# Patient Record
Sex: Male | Born: 1955 | Race: Black or African American | Hispanic: No | Marital: Single
Health system: Western US, Academic
[De-identification: ages and names within clinical notes are randomized; demographics above are authoritative.]

---

## 2019-09-30 IMAGING — MR MRI LUMBAR SPINE WITHOUT CONTRAST
6 series · 48 of 48 positions shown · non-contrast
Comparison: none

MAGNETIC RESONANCE IMAGING LUMBAR SPINE WITHOUT CONTRAST ADMINISTRATION
HISTORY: Lumbago.  Low back pain and left lumbar radiculopathy.  Claustrophobia.
TECHNIQUE: Multiplanar magnetic resonance imaging of the lumbar spine was accomplished employing a surface coil and an open 0.3[employer] [place] scanner without intravenous contrast administration.  T1 and T2-weighted thin slice sections were obtained.

[Series 4: scano sag-cor · coronal · 7.0mm · 1.48mm/px · 7 of 12 slices shown]
[im 1/12]
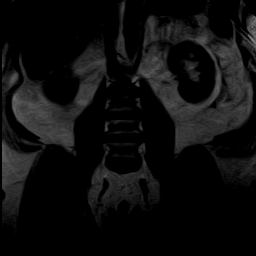
[im 2/12]
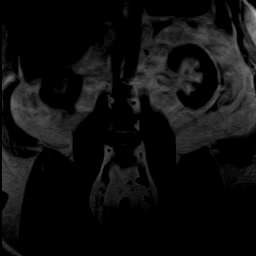
[im 4/12]
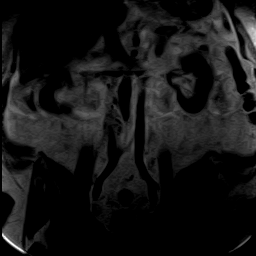
[im 6/12]
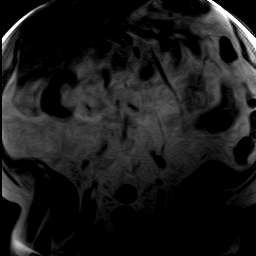
[im 8/12]
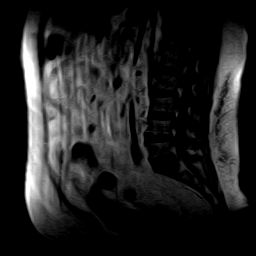
[im 10/12]
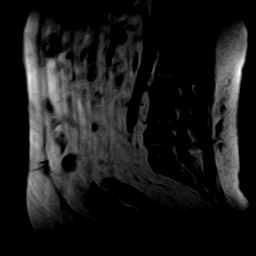
[im 12/12]
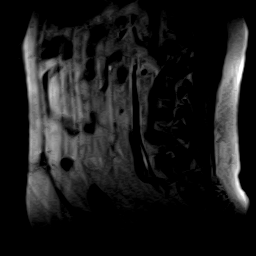

[Series 5: T2 · sagittal · 5.0mm · 1.21mm/px · 5 of 11 slices shown (1 of 2)]
[im 1/11]
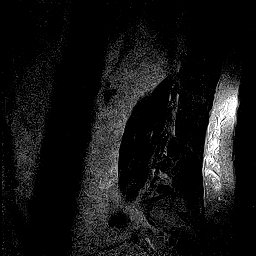
[im 3/11]
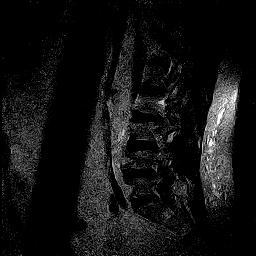
[im 6/11]
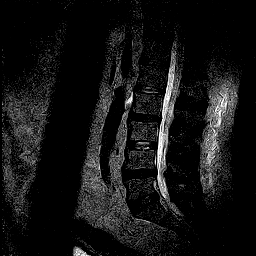
[im 8/11]
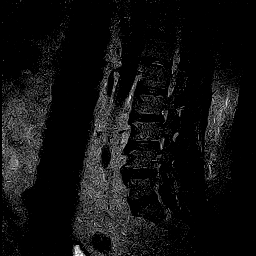
[im 11/11]
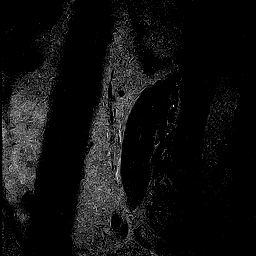

[Series 6: T1 · sagittal · 5.0mm · 1.21mm/px · 5 of 11 slices shown (1 of 2)]
[im 1/11]
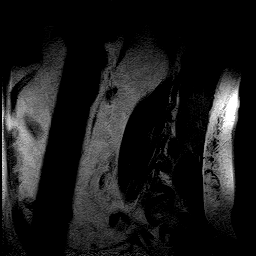
[im 3/11]
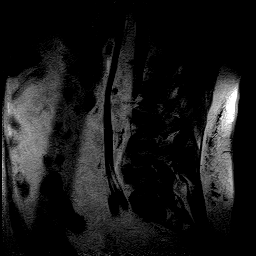
[im 6/11]
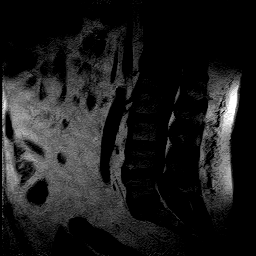
[im 8/11]
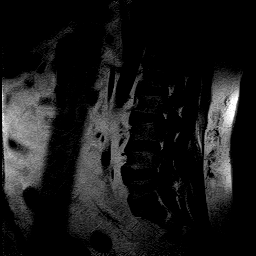
[im 11/11]
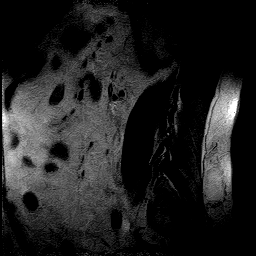

[Series 7: T1 · axial · 4.5mm · 1.05mm/px · z∈[-73,+109]mm · 13 of 28 slices shown (2 of 2)]
[im 1/28]
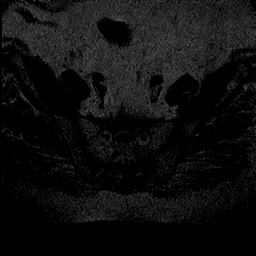
[im 3/28]
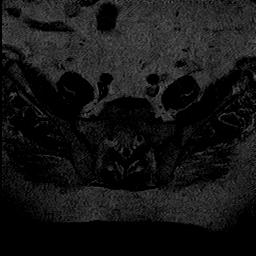
[im 5/28]
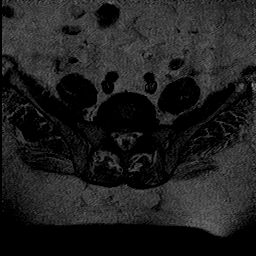
[im 7/28]
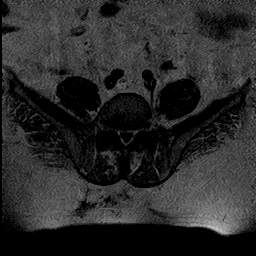
[im 10/28]
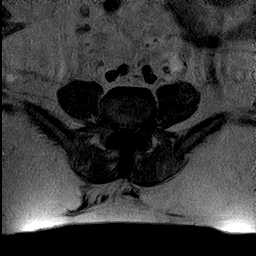
[im 12/28]
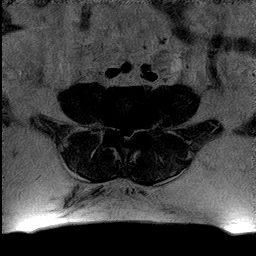
[im 14/28]
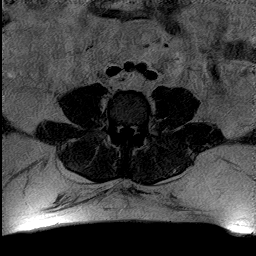
[im 16/28]
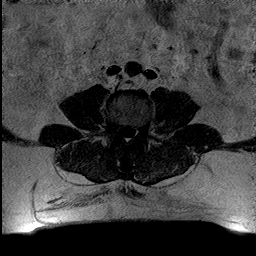
[im 19/28]
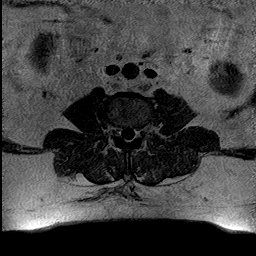
[im 21/28]
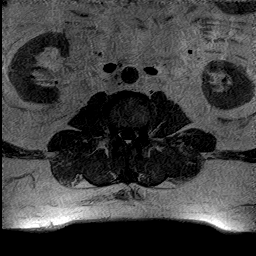
[im 23/28]
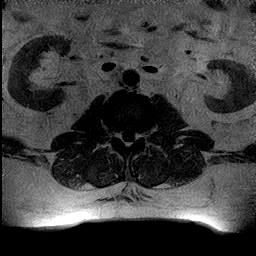
[im 25/28]
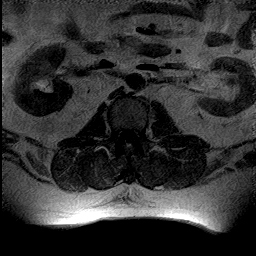
[im 28/28]
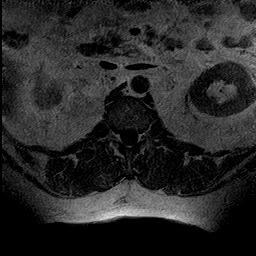

[Series 8: T2 · axial · 4.5mm · 1.05mm/px · z∈[-73,+109]mm · 13 of 28 slices shown (2 of 2)]
[im 1/28]
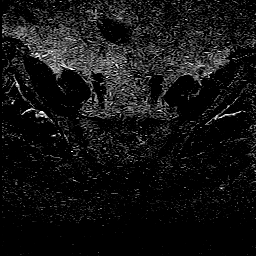
[im 3/28]
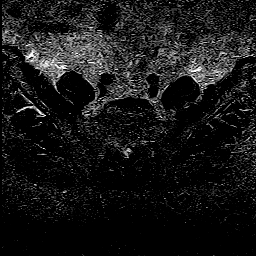
[im 5/28]
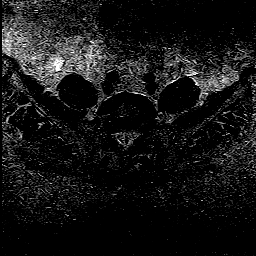
[im 7/28]
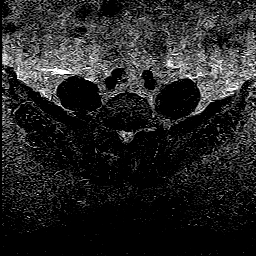
[im 10/28]
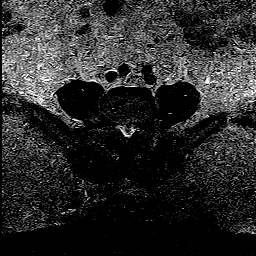
[im 12/28]
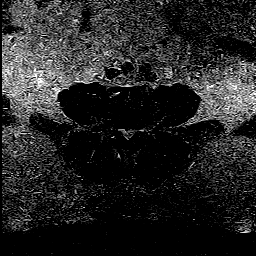
[im 14/28]
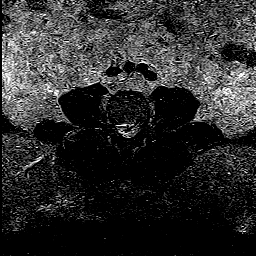
[im 16/28]
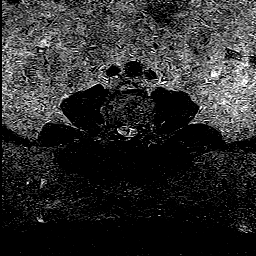
[im 19/28]
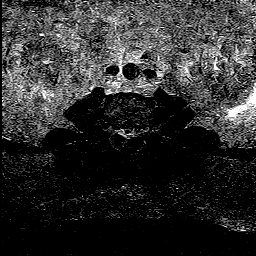
[im 21/28]
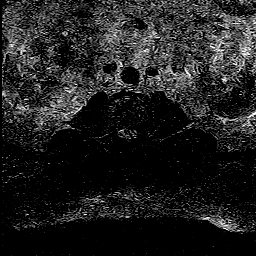
[im 23/28]
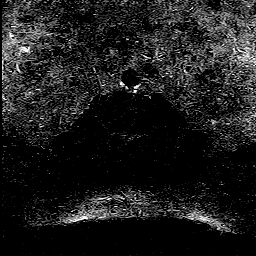
[im 25/28]
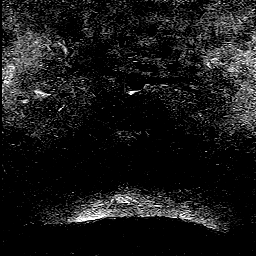
[im 28/28]
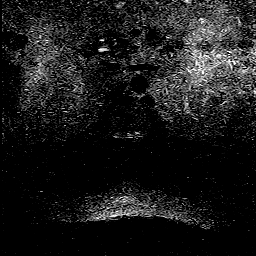

[Series 9: sag fir · sagittal · 5.0mm · 1.21mm/px · 5 of 11 slices shown]
[im 1/11]
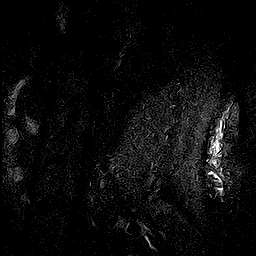
[im 3/11]
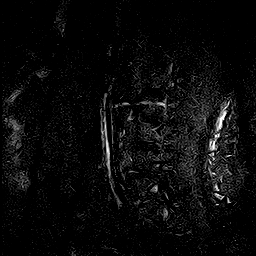
[im 6/11]
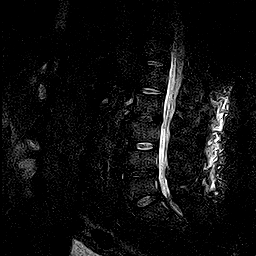
[im 8/11]
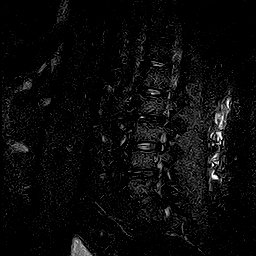
[im 11/11]
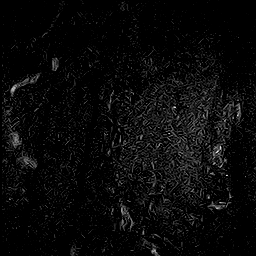

[48 of 48 positions shown; findings below may reference images not displayed]

FINDINGS: No prior studies are available for comparison purposes.

There is mild patient motion and spacial mismapping.  Patient claustrophobic. 

A minor lumbar levoscoliosis is demonstrated.  Anterior to posterior vertebral alignment maintained.  The lumbar vertebrae themselves appear normal in height.  There are no significant abnormalities of regional bone marrow signal.  

Visualized conus unremarkable.  Small (12 mm) left renal upper pole nodule suspected on coronal scout imaging alone.  Tiny bilateral renal cysts included on axial images.  Persistent left-sided inferior vena cava.  No additional focal abnormalities of visualized paraspinal soft tissues apparent. 

An 8 mm nodular focus of diminished T1 signal intensity left medial iliac bone, opposing sacroiliac joint, possibly degenerative sclerosis alone. 

Moderate lumbar degenerative disc disease present diffusely with disc desiccation and disc space narrowing.  Spondylotic change present as well with facet joint and ligamentous overgrowth and extradural spurring. 

Minor inferior epidural lipomatosis. 

There is mild inferior lumbar congenital shortness of vertebral pedicles.  

Examination of the L4-L5 interspace demonstrates spondylosis with moderate broad-based degenerative disc and annular bulging ventrally though significant facet joint and ligamentous overgrowth dorsally.  Moderate bilateral lateral recess stenoses are present.  Moderate congenital and acquired central spinal canal stenosis with the AP diameter of the thecal sac estimated at approximately 7 mm.  Minor foraminal narrowing without compromise. 

Examination of the L5-S1 interspace demonstrates early spondylosis with degenerative disc and annular bulging, capped by exuberant epidural fat.  There is no significant accentuation of congenital spinal canal narrowing.  Minor right greater than left L5 neural foraminal stenoses.  

Early spondylosis L2-L3 and L3-L4 interspaces without canal or foraminal compromise.  There are no significant extradural abnormalities at L1-L2.
IMPRESSION: Moderate lumbar spondylosis and degenerative disc disease in patient with epidural lipomatosis and mild congenital shortness of vertebral pedicles.

Congenital and acquired moderate bilateral lateral recess and central spinal canal stenoses at the L4-L5 interspace as discussed.  

Tiny focus of nonspecific bone marrow signal abnormality medial left iliac bone, possibly degenerative sclerosis alone.  

Suspected tiny renal cyst.  Small partially visualized left renal upper pole cortical nodule which could represent a cyst alone though is indeterminate as this is seen on only localization evaluation of spine.

Please see in-depth description by level in body of report.

## 2019-10-01 ENCOUNTER — Ambulatory Visit: Admit: 2019-10-01 | Discharge: 2019-10-15 | Payer: PRIVATE HEALTH INSURANCE | Primary: Physician

## 2019-10-01 DIAGNOSIS — Z794 Long term (current) use of insulin: Secondary | ICD-10-CM

## 2019-10-01 DIAGNOSIS — E119 Type 2 diabetes mellitus without complications: Secondary | ICD-10-CM

## 2019-10-01 DIAGNOSIS — E785 Hyperlipidemia, unspecified: Secondary | ICD-10-CM

## 2019-10-01 DIAGNOSIS — I10 Essential (primary) hypertension: Secondary | ICD-10-CM

## 2019-10-01 MED ORDER — VICTOZA 2-PAK 0.6 MG/0.1 ML (18 MG/3 ML) SUBCUTANEOUS PEN INJECTOR
0.60.1183 mg/0.1 mL (18 mg/3 mL) | Freq: Every day | SUBCUTANEOUS | 11 refills | Status: AC
Start: 2019-10-01 — End: 2022-10-01

## 2019-10-01 MED ORDER — ONETOUCH VERIO TEST STRIPS
ORAL_STRIP | 11 refills | Status: DC
Start: 2019-10-01 — End: 2019-11-21

## 2019-10-01 NOTE — Patient Instructions (Addendum)
Assess 1   Type 2 diabetes doing well with occasional hypoglycemia.  He must cut back on food and exercise more if possible and once he starts this new lifestyle, he should decrease his insulin by 50% for starters and then readjust as needed afterward. He sees an Administrator, arts in [place], [place]. (Dr. Jacelyn Grip)  2  Hypertension  3  Hyperlipidemia  4  Chronic back Pain  Plan   Labs in three months  Hemoglobin A1C,   24 hour urine microalbumin (needed annually for diabetes)  Chemistry Panel  CBC,  Lipids  UA  C-Reactive Protein   Use Dexcom G6 CGM for two weeks every three months.  See the eye doctor annually and least once year and he has an eye doctor visit scheduled in August, 2021.  See me in three months

## 2019-10-01 NOTE — Progress Notes (Addendum)
I performed this evaluation using real-time telehealth tools, including a live video Zoom connection between my location and the patient's location. Prior to initiating, I obtained informed verbal consent to perform this evaluation using telehealth tools and answered all the questions about the telehealth interaction.     CC  Type 2 diabetes x 31 years  HPI-   He also has hypertension. Home BP readings are usually around being in the 120s over the 70s.  No known diabetes end organ complications. No history of MI or CVA.    Current Meds  1) Insulin: Lantus 50 -100 Units twice daily  2) Humalog 35-45 Units 3-4 x per day  3) Victoza 1.8 mg daily each AM (control improved greatly after he started this medication and he had fewer hypoglycemic episodes after he started this medication).  4) Crestor 20 mg/day  5) Carvedilol   6) Allopurinol  7) HCTZ 50 mg/day  8) Furosemide  9) Spironolactone  10) Gabapentin  11) Benzonate (Tessalon)  12) Tamsulosin  13) Topiramate (for pain)  14) Amlodipine  15) Clotrimazole Cream  16) Cyproheptadine (Peri-Actin)    He had bilateral cataract surgery in December 2020 January 2021.    He has used an [employer] and found it helpful  preventing.  His insurance will not cover [facility] but they will cover the  Dexcom G6.  He thinks he has better coverage for the Dexcom rather than the [employer]).  He prefers as long as possible.    Severe hypoglycemia has occurred with him about twice yearly down to 35 mg/dl.  He has never required assistance but he gets confused,  when he has symptoms of hypoglycemia.  This might occur if he takes his insulin but ends up not eating because he is distracted for some reason.    He was not issued BG test strips in the past four months because of no coverage.  He tests 6-7 times per day usually and BGs woud run 85-160 with .  The 160 reading occur at bedtime.  He adjusts his insulin dose based on the BG levels.  For example, if premeal  BG is high then he might increase the Humalog from 35 to 45 Units at a meal.  He does not adjust the Lantus dose.  He occasionally awakens with hypoglycemic symptoms.  He has taken four classes about diabetes.  No recent A1C.  In [place] in December   It was under 7.0 (around 6.7%)  He complains of hand tingling.  Diet  one meal per day. It can be as little as a bowl of oat cereal and milk, plus a meat sandwich on pumpernickel bread.  He has lost 30 pounds in the past year.  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  He will be on disability soon because of back and left lower extremity and neck injuries.  PE  height 5 9.5  weight 248 pounds  Assess 1   Type 2 diabetes doing well with occasional hypoglycemia.  He must cut back on food and exercise more if possible and once he starts this new lifestyle, he should decrease his insulin by 50% for starters and then readjust as needed afterward. He sees an Administrator, arts in [place], [place]. (Dr. Jacelyn Grip)  2  Hypertension  3  Hyperlipidemia  4  Chronic back Pain  Plan   Labs in three months  Hemoglobin A1C,   24 hour urine microalbumin (  needed annually for diabetes)  Chemistry Panel  CBC,  Lipids  UA  C-Reactive Protein  Use Dexcom G6 CGM for two weeks every three months.  See the eye doctor annually and least once year and he has an eye doctor visit scheduled in August, 2021.  See me in three months    ADDENDUM -   The patient checks his blood glucose levels 6-7 times per day.  The patient's insulin treatment regimen requires frequent adjustments due to self-testing results.  The patient has had a doctor's visit with me as of October 01, 2019, which is within six months of this addendum.  Elisha Headland, MD  December 17, 2019

## 2019-10-23 IMAGING — MR MRI CERVICAL SPINE WITHOUT CONTRAST
8 of 9 series · 34 of 48 positions shown · non-contrast
Comparison: none

MAGNETIC RESONANCE IMAGING CERVICAL SPINE WITHOUT CONTRAST ADMINISTRATION
HISTORY: Cervicalgia.  Claustrophobia.
TECHNIQUE: Multiplanar magnetic resonance imaging of the cervical spine was accomplished utilizing a surface coil and an open 0.3[employer] scanner without intravenous contrast administration.  T1 and T2-weighted thin slice sections were obtained.

[Series 3: sag scano · sagittal · 4.0mm · 1.09mm/px · 2 of 7 slices shown]
[im 1/7]
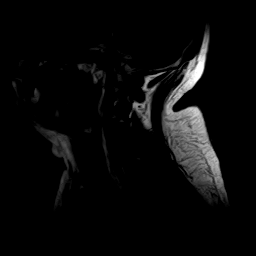
[im 7/7]
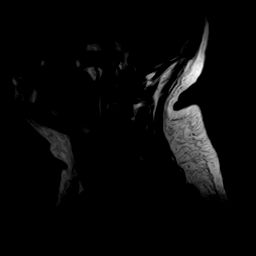

[Series 4: cor scano · coronal · 4.0mm · 1.09mm/px · 1 of 5 slices shown]
[im 1/5]
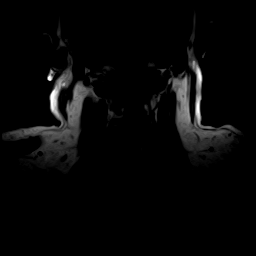

[Series 5: T2 · sagittal · 4.0mm · 0.94mm/px · 4 of 11 slices shown]
[im 1/11]
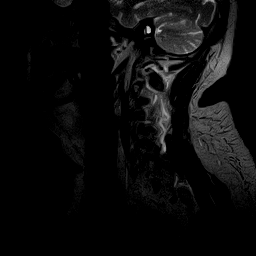
[im 4/11]
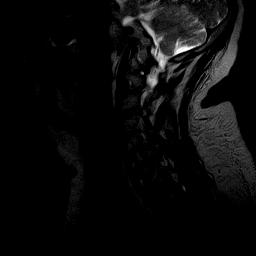
[im 7/11]
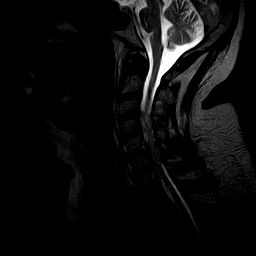
[im 11/11]
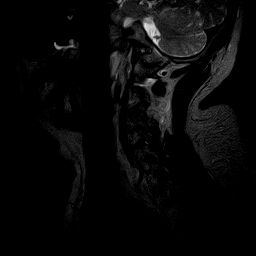

[Series 6: sag fir · sagittal · 4.5mm · 1.02mm/px · 4 of 11 slices shown]
[im 1/11]
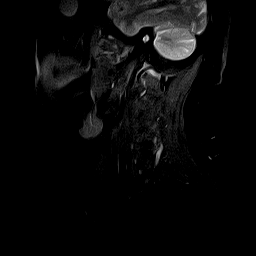
[im 4/11]
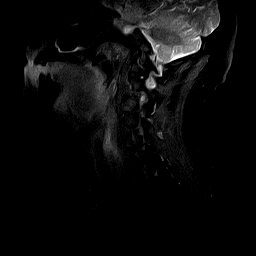
[im 7/11]
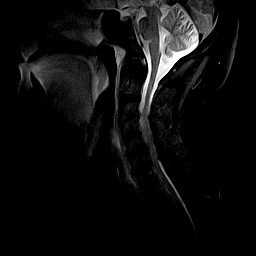
[im 11/11]
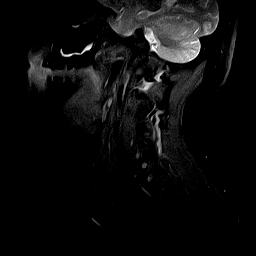

[Series 7: trs ge · axial · 4.0mm · 0.94mm/px · z∈[-107,+4]mm · 9 of 24 slices shown]
[im 1/24]
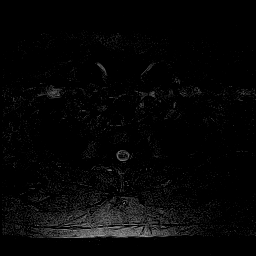
[im 3/24]
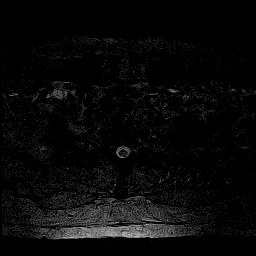
[im 6/24]
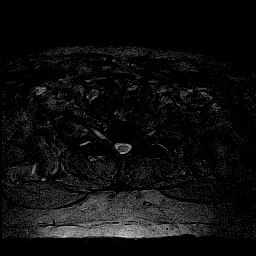
[im 9/24]
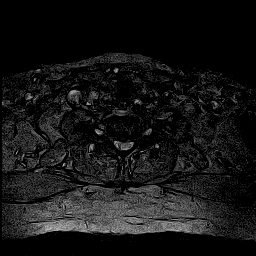
[im 12/24]
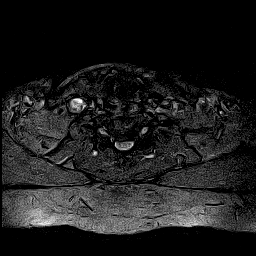
[im 15/24]
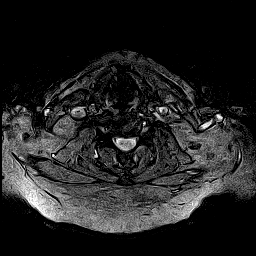
[im 18/24]
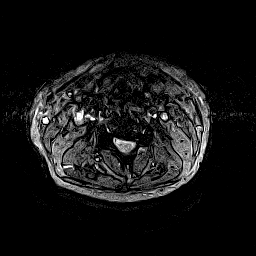
[im 21/24]
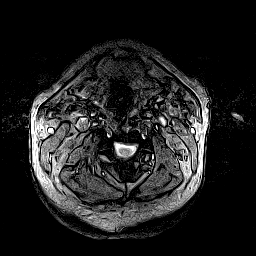
[im 24/24]
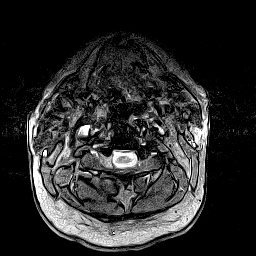

[Series 8: cervical l · axial · 4.0mm · 0.94mm/px · z∈[-107,+4]mm · 9 of 24 slices shown]
[im 1/24]
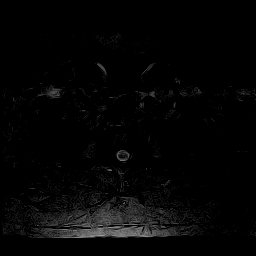
[im 3/24]
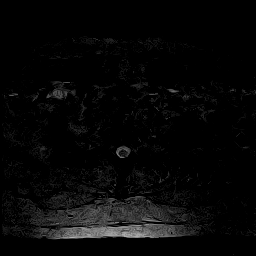
[im 6/24]
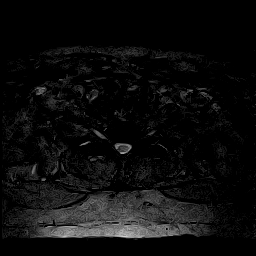
[im 9/24]
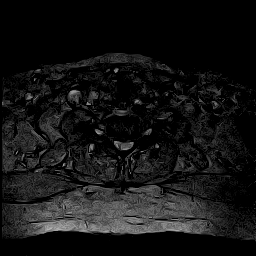
[im 12/24]
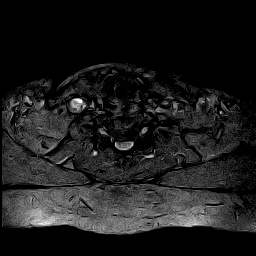
[im 15/24]
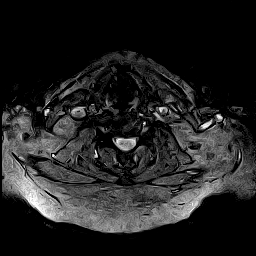
[im 18/24]
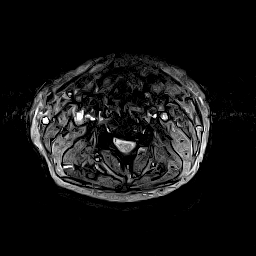
[im 21/24]
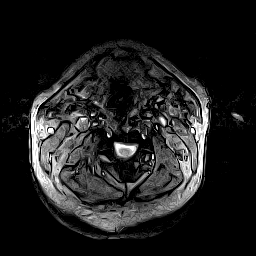
[im 24/24]
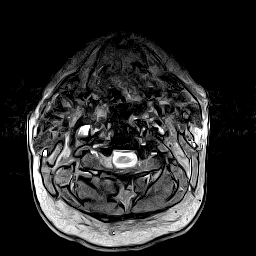

[Series 9: cor shim · coronal · 10.0mm · 4.69mm/px · 1 of 3 slices shown]
[im 1/3]
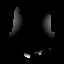

[Series 11: T1 · sagittal · 4.0mm · 0.94mm/px · 4 of 11 slices shown]
[im 1/11]
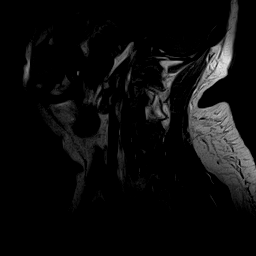
[im 4/11]
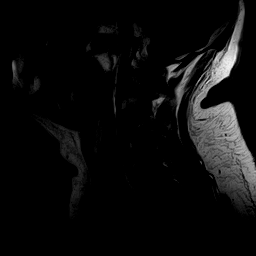
[im 7/11]
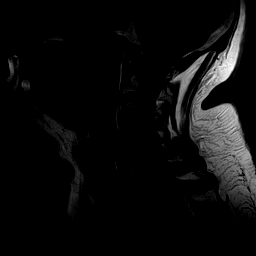
[im 11/11]
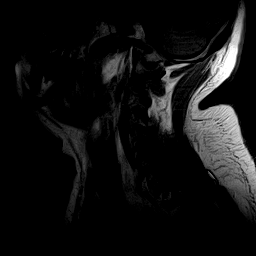

[34 of 48 positions shown; findings below may reference images not displayed]

FINDINGS: There is mild patient motion and spacial mismapping on all imaging sequences.  Patient claustrophobic. 

A moderate rotatory cervical levoscoliosis is apparent.  Anterior to posterior vertebral alignment maintained.  The cervical vertebrae themselves appear normal in height.  I can identify no significant abnormalities of regional bone marrow signal. 

The region of the foramen magnum appears normal.  Evidence for mild chronic bilateral maxillary paranasal sinusitis.  No focal abnormalities of the visualized paraspinal soft tissues are appreciated. 

Moderate cervical degenerative disc disease is present diffusely with disc desiccation and disc space narrowing.  Moderate spondylotic change present as well with extradural spurring and facet joint sclerosis.

Examination of the C5-C6 interspace demonstrates moderate spondylosis with prominent ventral disc and spur complex, slightly eccentric to right.  There may be a tiny right paracentral nuclear herniation.  In any event, there is significant central and right greater than left lateral cervical spinal canal stenosis with mild cord compression and attenuation.  AP diameter of spinal canal estimated at 7.5 mm.  There are moderate bilateral C6 neural foraminal stenoses.  

Examination of the C6-C7 interspace demonstrates spondylosis with moderate sized broad-based ventral disc and spur complex and suspected small central disc protrusion/nuclear herniation.  Significant central and lateral cervical spinal canal stenosis is again observed with cord compression and mild cord attenuation.  AP diameter of spinal canal estimated at approximately 7 mm.  There are moderate to severe bilateral C7 neural foraminal stenoses.  

Examination of the C3-C4 interspace demonstrates spondylosis eccentric left with moderate spur.  Moderate to severe left and mild to moderate right C4 neural foraminal stenoses.  No significant canal narrowing nor cord compression. 

Examination of the C4-C5 interspace demonstrates spondylosis with small ventral disc and spur complex, slightly eccentric laterally to the left.  There are moderate bilateral C5 neural foraminal stenoses.  

Minor annular bulging C7-T1 interspace without canal or foraminal compromise.  Early spondylosis C2-C3.  There are no significant extradural abnormalities at the C1-C2 articulation.  

It is difficult to assess the cervical spinal cord due to excessive patient motion.  There is no definitive evidence for cord enlargement or localized myelomalacia.
IMPRESSION: Moderate cervical levoscoliosis.

Moderately severe generalized cervical spondylosis and degenerative disc disease as described. 

Significant central and lateral cervical spinal canal stenoses at C6-C7 greater than C5-C6 interspaces as discussed.  There is cord compression. 

Moderate to severe bilateral cervical neural foraminal stenoses as described by level in body of report.

Mild chronic bilateral maxillary paranasal sinusitis. 

Please see in-depth discussion.

## 2019-10-24 NOTE — Telephone Encounter (Signed)
Per Dr. Alessandra Bevels, would like to order Dexcom G6.     I tried to call pt-- no answer and no option to leave a voice message.     I sent pt a detailed e-mail to confirm if ok our office sends Dexcom order to [facility].     Awaiting to hear back.

## 2019-11-15 MED ORDER — DEXCOM G6 RECEIVER MISC
0 refills | Status: AC
Start: 2019-11-15 — End: ?

## 2019-11-15 MED ORDER — DEXCOM G6 SENSOR DEVICE
11 refills | Status: AC
Start: 2019-11-15 — End: ?

## 2019-11-15 MED ORDER — DEXCOM G6 TRANSMITTER DEVICE
3 refills | Status: AC
Start: 2019-11-15 — End: ?

## 2019-11-19 NOTE — Telephone Encounter (Signed)
Dr. Alessandra Bevels called 5/28 @ 2:46pm, he left a voice message asking our office to to check on pt's blood glucose testing strips. Was told that the pt is not able to get the test strips, no other detail.

## 2019-11-19 NOTE — Telephone Encounter (Signed)
The patient is requesting a medication refill.     Requested medication:   Requested Prescriptions   Pending Prescriptions Disp Refills    ONETOUCH VERIO TEST STRIPS test strip 250 strip 11     Sig: TEST 6-8 TIMES DAILY AFTER EVERY MEAL       There is no refill protocol information for this order       lancets lancets 250 each 11     Sig: TEST 6-8 TIMES DAILY AFTER EVERY MEAL       There is no refill protocol information for this order       blood-glucose monitoring kit 1 each 0     Sig: USE TO TEST 6-8 TIMES DAILY AFTER EVERY MEAL       There is no refill protocol information for this order           Last appointment: October 01, 2019     Follow up appointment: Visit date not found     Please review and adjust accordingly.

## 2020-01-17 MED ORDER — INSULIN LISPRO (U-100) 100 UNIT/ML SUBCUTANEOUS PEN
100 | SUBCUTANEOUS | 3 refills | Status: DC
Start: 2020-01-17 — End: 2020-04-06

## 2020-01-17 MED ORDER — LIRAGLUTIDE 0.6 MG/0.1 ML (18 MG/3 ML) SUBCUTANEOUS PEN INJECTOR
0.60.1183 mg/0.1 mL (18 mg/3 mL) | SUBCUTANEOUS | 3 refills | Status: DC
Start: 2020-01-17 — End: 2020-08-12

## 2020-01-17 MED ORDER — LANCETS
11 refills | Status: DC
Start: 2020-01-17 — End: 2020-07-15

## 2020-01-17 MED ORDER — CONTOUR METER
0 refills | Status: AC
Start: 2020-01-17 — End: ?

## 2020-01-17 MED ORDER — INSULIN GLARGINE (U-100) 100 UNIT/ML (3 ML) SUBCUTANEOUS PEN
100 | SUBCUTANEOUS | 3 refills | 74.00000 days | Status: AC
Start: 2020-01-17 — End: 2020-07-15

## 2020-01-17 MED ORDER — CONTOUR TEST STRIPS
11 refills | Status: DC
Start: 2020-01-17 — End: 2020-03-19

## 2020-03-19 NOTE — Telephone Encounter (Signed)
The patient is requesting a medication refill.     Requested medication:   Requested Prescriptions   Pending Prescriptions Disp Refills    CONTOUR TEST STRIPS test strip 300 each 11     Sig: Use as directed. Test up to 8 times daily       There is no refill protocol information for this order           Last appointment: October 01, 2019     Follow up appointment: Visit date not found     Please review and adjust accordingly.

## 2020-04-06 NOTE — Telephone Encounter (Signed)
The patient is requesting a medication refill.     Requested medication:   Requested Prescriptions   Pending Prescriptions Disp Refills    insulin lispro (HUMALOG) 100 unit/mL injection pen 180 mL 3     Sig: Inject 35-45 units 3-4 times daily       There is no refill protocol information for this order           Last appointment: October 01, 2019     Follow up appointment: Visit date not found     Please review and adjust accordingly.

## 2020-04-24 MED ORDER — CONTOUR TEST STRIPS
11 refills | Status: DC
Start: 2020-04-24 — End: 2020-08-12

## 2020-04-24 MED ORDER — INSULIN LISPRO (U-100) 100 UNIT/ML SUBCUTANEOUS PEN
100 | SUBCUTANEOUS | 3 refills | 39.00000 days | Status: DC
Start: 2020-04-24 — End: 2021-01-19

## 2020-05-28 IMAGING — MR MRI CERVICAL SPINE WITHOUT CONTRAST
8 of 9 series · 34 of 48 positions shown · non-contrast
Comparison: none

﻿

MAGNETIC RESONANCE IMAGING CERVICAL SPINE WITHOUT CONTRAST ADMINISTRATION
HISTORY: Cervicalgia.  Spinal stenosis.  Claustrophobia.
TECHNIQUE: Multiplanar magnetic resonance imaging of the cervical spine was accomplished utilizing a surface coil and an open 0.[employer] scanner without intravenous contrast administration.  T1 and T2-weighted thin slice sections were obtained.

[Series 1: sag scano · sagittal · 4.0mm · 1.09mm/px · 2 of 7 slices shown]
[im 1/7]
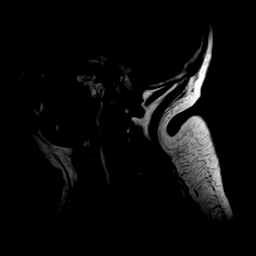
[im 7/7]
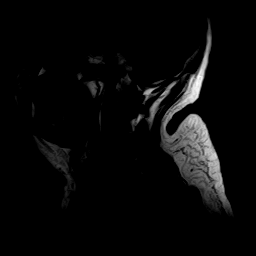

[Series 2: cor scano · coronal · 4.0mm · 1.09mm/px · 1 of 5 slices shown]
[im 1/5]
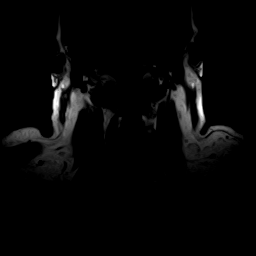

[Series 3: T2 · sagittal · 4.0mm · 0.94mm/px · 4 of 11 slices shown]
[im 1/11]
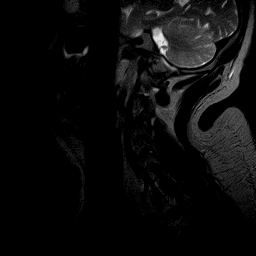
[im 4/11]
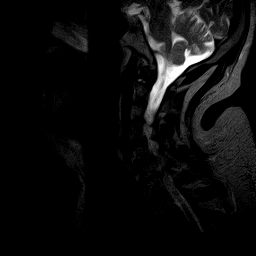
[im 7/11]
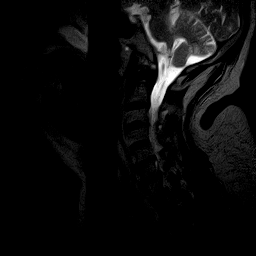
[im 11/11]
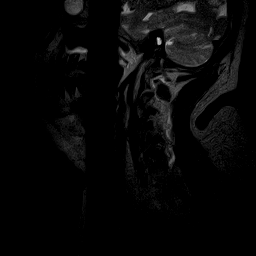

[Series 4: sag fir · sagittal · 4.5mm · 1.02mm/px · 4 of 11 slices shown]
[im 1/11]
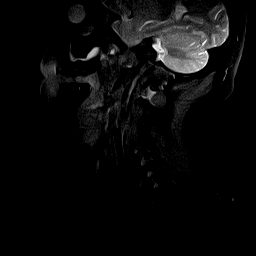
[im 4/11]
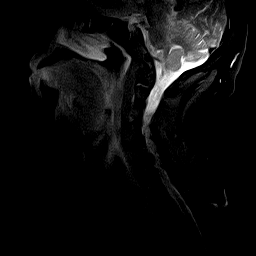
[im 7/11]
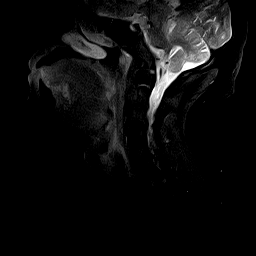
[im 11/11]
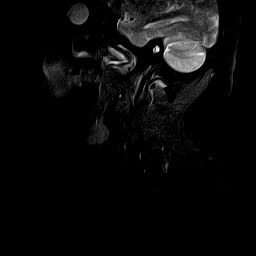

[Series 5: trs ge · axial · 4.0mm · 0.94mm/px · z∈[-112,+2]mm · 9 of 24 slices shown]
[im 1/24]
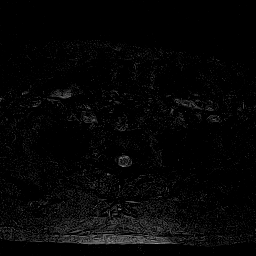
[im 3/24]
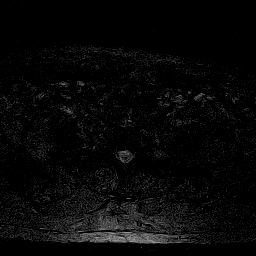
[im 6/24]
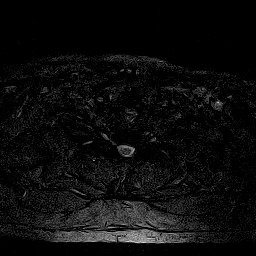
[im 9/24]
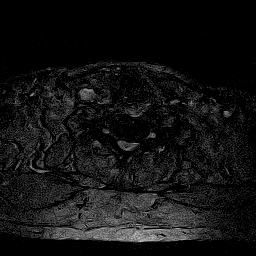
[im 12/24]
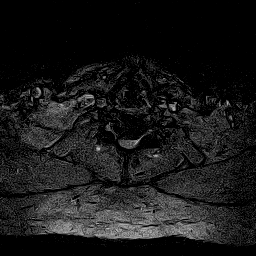
[im 15/24]
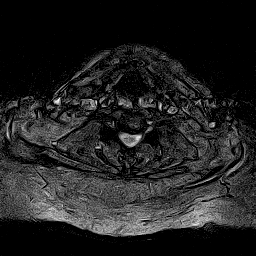
[im 18/24]
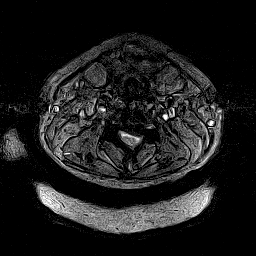
[im 21/24]
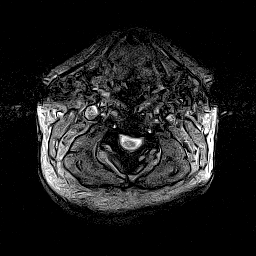
[im 24/24]
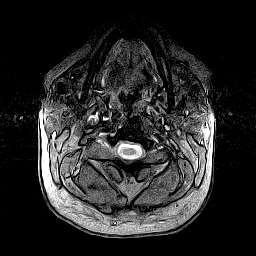

[Series 6: cervical l · axial · 4.0mm · 0.94mm/px · z∈[-112,+2]mm · 9 of 24 slices shown]
[im 1/24]
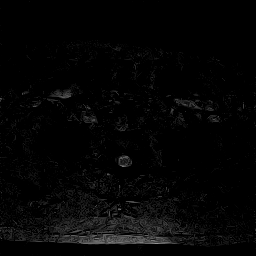
[im 3/24]
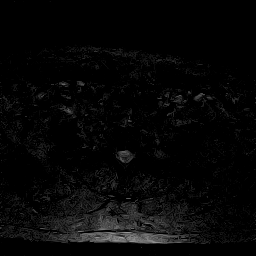
[im 6/24]
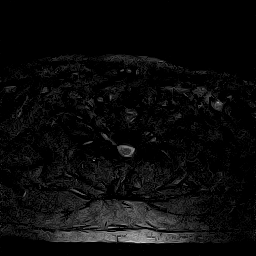
[im 9/24]
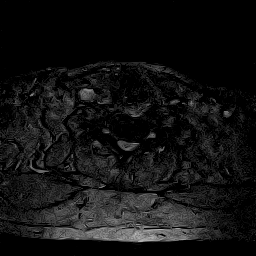
[im 12/24]
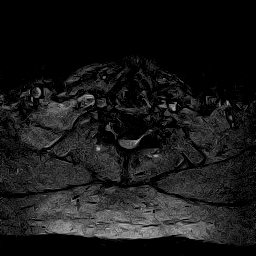
[im 15/24]
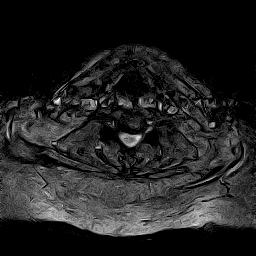
[im 18/24]
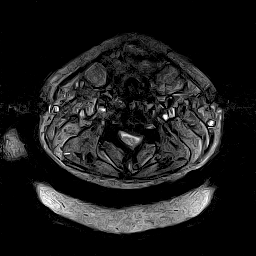
[im 21/24]
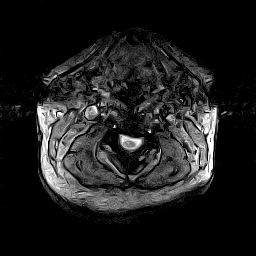
[im 24/24]
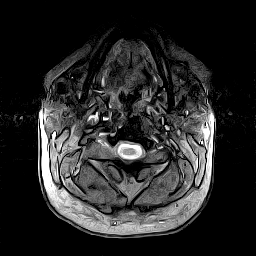

[Series 7: T1 · sagittal · 4.0mm · 0.94mm/px · 4 of 11 slices shown]
[im 1/11]
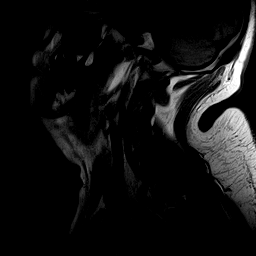
[im 4/11]
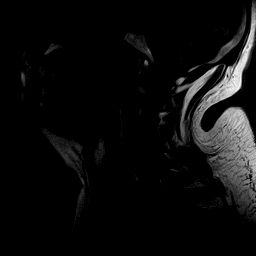
[im 7/11]
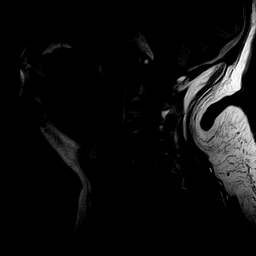
[im 11/11]
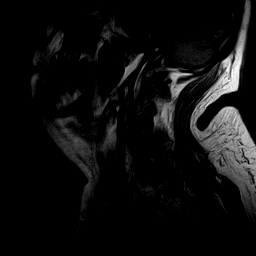

[Series 8: cor shim · coronal · 10.0mm · 4.69mm/px · 1 of 3 slices shown]
[im 1/3]
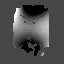

[34 of 48 positions shown; findings below may reference images not displayed]

FINDINGS: Comparison made to prior MRI cervical spine evaluation from [facility] dated 10/23/2019. 

Again, there is excessive patient motion and spacial mismapping.  Patient claustrophobic. 

Moderate rotatory levoscoliosis again observed.  Trace anterolisthesis of C4 upon C5 in part related to scoliotic curve.  The cervical vertebrae appear normal in height.  There are no significant abnormalities of regional bone marrow signal. 

Mild chronic-appearing paranasal sinusitis.  The region of the foramen magnum appears normal. 

Moderately severe generalized cervical degenerative disc disease and spondylotic changes again noted observed with disc desiccation and disc space narrowing, extradural spurring, and significant facet joint sclerosis with mild overgrowth.

Examination of the C5-C6 interspace demonstrates significant spondylosis with ventral disc and spur complex and progression of small central to right paracentral disc protrusion.  Slight progression of central and lateral cervical spinal canal stenosis with ventral cord flattening and mild cord attenuation.  AP diameter of spinal canal estimated at approximately 7 mm focally.  There are moderate to severe bilateral C6 neural foraminal stenoses.  

Examination of the C6-C7 interspace demonstrates spondylosis with ventral disc and spur complex and small broad-based central nuclear herniation.  Spinal canal stenosis at and superior to level of interspace with ventral cord flattening and mild cord attenuation.  AP diameter of spinal canal estimated at 7 mm.  Moderate to severe bilateral C7 neural foraminal stenoses.  

Examination of the C3-C4 interspace demonstrates spondylosis with ventral disc and spur complex, eccentric left.  No significant spinal canal stenosis.  Left greater than right bilateral C4 neural foraminal stenoses.  

Early spondylosis C4-C5 interspace without spinal canal stenosis.  Moderate bilateral C5 foraminal stenoses.

Minor annular bulging C7-T1 interspace without clear evidence for canal or foraminal compromise.  Early spondylosis C2-C3, again without canal or foraminal compromise. 

When allowance is made for patient motion, there is no clear evidence for localized myelomalacia nor cord enlargement.
IMPRESSION: 1. Slight progression of spondylosis and disc disease C5-C6 interspace with progression of central and lateral cervical spinal canal stenosis as described. 

2. Stable central and lateral cervical spinal canal stenosis C6-C7 interspace, at and superior to level of interspace. 

3. Stable significant bilateral cervical neural foraminal stenoses.

4. Mild chronic paranasal sinusitis, partially visualized.

5. Please see in-depth discussion in body of report.

## 2020-07-15 NOTE — Telephone Encounter (Signed)
Requested Prescriptions     Pending Prescriptions Disp Refills    insulin glargine (LANTUS, BASAGLAR) 100 unit/mL (3 mL) injection pen 180 mL 3     Sig: Inject 50-100 units twice daily

## 2020-07-15 NOTE — Telephone Encounter (Signed)
Requested Prescriptions     Pending Prescriptions Disp Refills    lancets lancets 300 each 11     Sig: TEST 8 TIMES DAILY AFTER EVERY MEAL

## 2020-08-05 MED ORDER — INSULIN GLARGINE (U-100) 100 UNIT/ML (3 ML) SUBCUTANEOUS PEN
100 | SUBCUTANEOUS | 3 refills | Status: DC
Start: 2020-08-05 — End: 2020-08-12

## 2020-08-05 MED ORDER — LANCETS
11 refills | Status: DC
Start: 2020-08-05 — End: 2020-08-12

## 2020-08-13 MED ORDER — CONTOUR TEST STRIPS
11 refills | Status: AC
Start: 2020-08-13 — End: ?

## 2020-08-13 MED ORDER — LANCETS
11 refills | Status: AC
Start: 2020-08-13 — End: ?

## 2020-08-13 MED ORDER — LIRAGLUTIDE 0.6 MG/0.1 ML (18 MG/3 ML) SUBCUTANEOUS PEN INJECTOR
0.60.1183 mg/0.1 mL (18 mg/3 mL) | SUBCUTANEOUS | 3 refills | Status: AC
Start: 2020-08-13 — End: 2021-01-19

## 2020-08-13 MED ORDER — INSULIN GLARGINE (U-100) 100 UNIT/ML (3 ML) SUBCUTANEOUS PEN
100 | SUBCUTANEOUS | 3 refills | Status: AC
Start: 2020-08-13 — End: ?

## 2021-01-19 NOTE — Telephone Encounter (Signed)
The patient is requesting a medication refill.     Requested medication:   Requested Prescriptions   Pending Prescriptions Disp Refills    insulin lispro (HUMALOG) 100 unit/mL injection pen 180 mL 3     Sig: Inject 35-45 units 3-4 times daily       There is no refill protocol information for this order       liraglutide (VICTOZA) 0.6 mg/0.1 mL (18 mg/3 mL) injection pen 18 mL 3     Sig: Inject 1.8 mg daily       There is no refill protocol information for this order           Last appointment: October 01, 2019     Follow up appointment: Visit date not found     Please review and adjust accordingly.

## 2021-02-02 MED ORDER — LIRAGLUTIDE 0.6 MG/0.1 ML (18 MG/3 ML) SUBCUTANEOUS PEN INJECTOR
0.6 | SUBCUTANEOUS | 3 refills | Status: AC
Start: 2021-02-02 — End: ?

## 2021-02-02 MED ORDER — INSULIN LISPRO (U-100) 100 UNIT/ML SUBCUTANEOUS PEN
100 | SUBCUTANEOUS | 3 refills | Status: AC
Start: 2021-02-02 — End: ?

## 2021-10-29 IMAGING — MR MRI LUMBAR SPINE WITHOUT CONTRAST
6 series · 48 of 48 positions shown · non-contrast
Comparison: none

﻿

MAGNETIC RESONANCE IMAGING OF THE LUMBAR SPINE WITHOUT INTRAVENOUS CONTRAST ADMINISTRATION
HISTORY: Low back pain.  Left lumbar radiculopathy.  History of prior surgery.  Claustrophobia.
TECHNIQUE: Magnetic resonance imaging of the lumbar spine was accomplished employing a surface coil and an open 0.3[employer] Abdi scanner without intravenous contrast administration.  T1 and T2-weighted thin slice sections were obtained.

[Series 1: s-c scano · coronal · 6.0mm · 1.17mm/px · 5 of 11 slices shown]
[im 1/11]
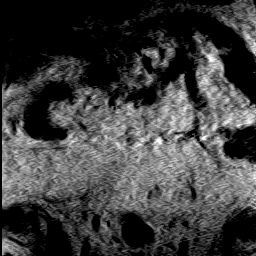
[im 3/11]
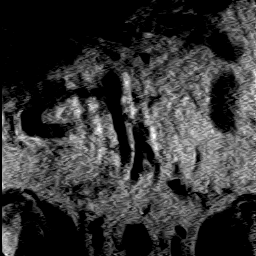
[im 6/11]
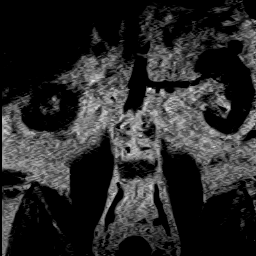
[im 8/11]
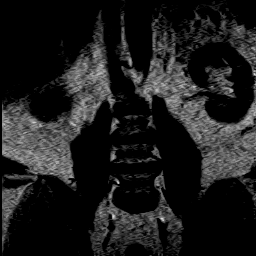
[im 11/11]
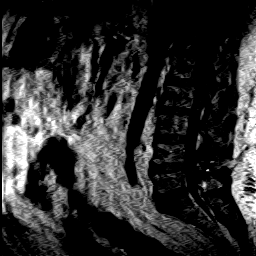

[Series 2: T2 · sagittal · 5.0mm · 1.13mm/px · 5 of 11 slices shown (1 of 2)]
[im 1/11]
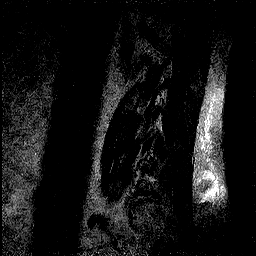
[im 3/11]
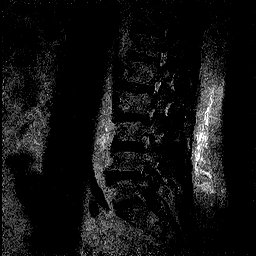
[im 6/11]
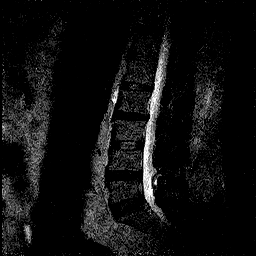
[im 8/11]
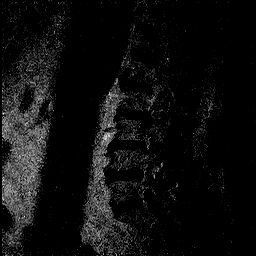
[im 11/11]
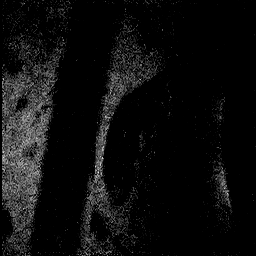

[Series 3: T1 · sagittal · 5.0mm · 1.13mm/px · 5 of 11 slices shown (1 of 2)]
[im 1/11]
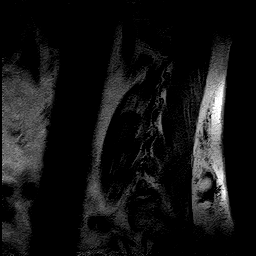
[im 3/11]
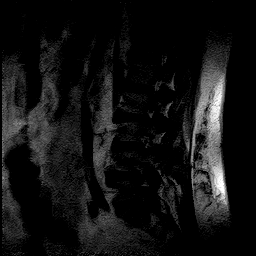
[im 6/11]
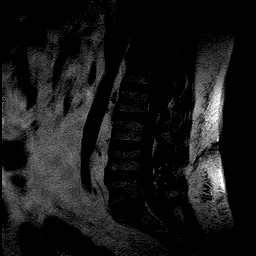
[im 8/11]
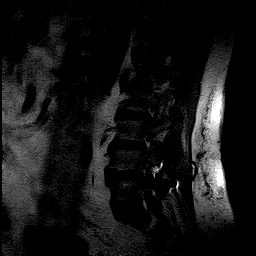
[im 11/11]
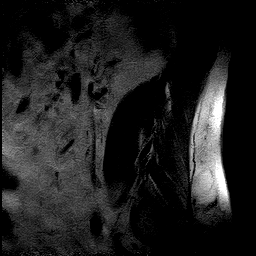

[Series 4: sag fir · sagittal · 5.0mm · 1.13mm/px · 5 of 11 slices shown]
[im 1/11]
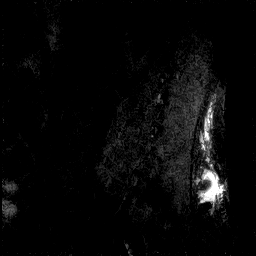
[im 3/11]
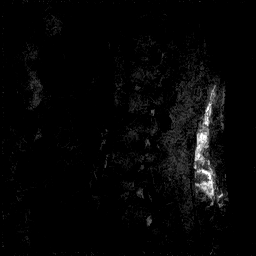
[im 6/11]
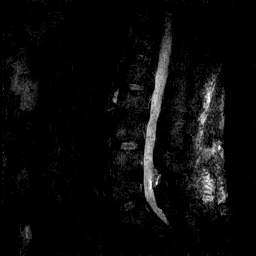
[im 8/11]
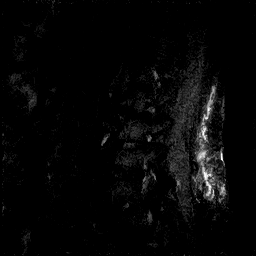
[im 11/11]
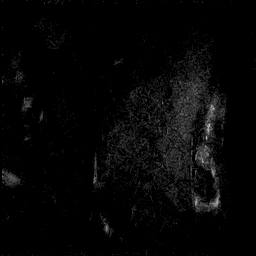

[Series 5: T2 · axial · 4.0mm · 1.09mm/px · z∈[-89,+60]mm · 14 of 31 slices shown (2 of 2)]
[im 1/31]
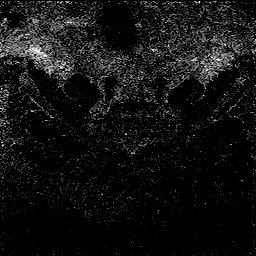
[im 3/31]
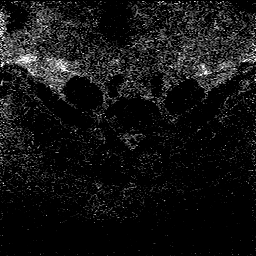
[im 5/31]
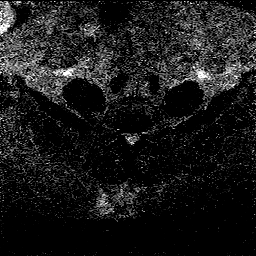
[im 7/31]
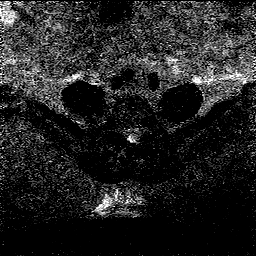
[im 10/31]
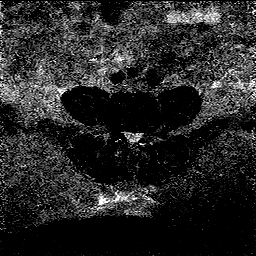
[im 12/31]
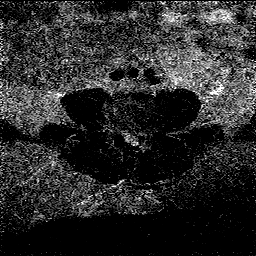
[im 14/31]
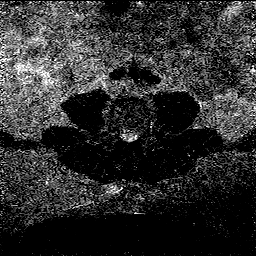
[im 17/31]
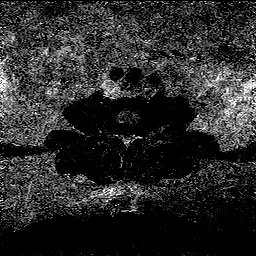
[im 19/31]
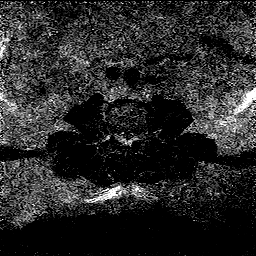
[im 21/31]
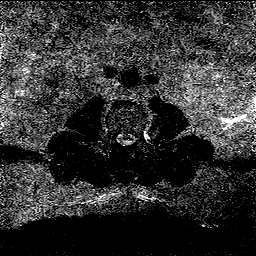
[im 24/31]
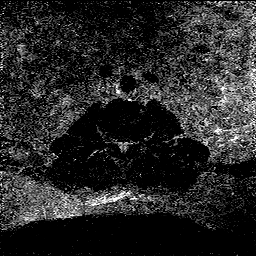
[im 26/31]
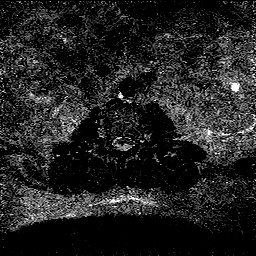
[im 28/31]
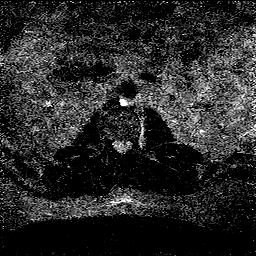
[im 31/31]
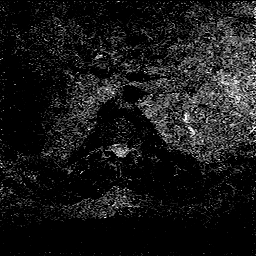

[Series 6: T1 · axial · 4.0mm · 1.09mm/px · z∈[-89,+60]mm · 14 of 31 slices shown (2 of 2)]
[im 1/31]
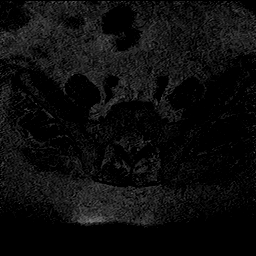
[im 3/31]
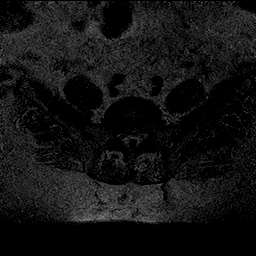
[im 5/31]
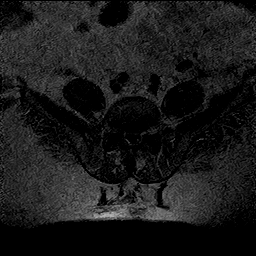
[im 7/31]
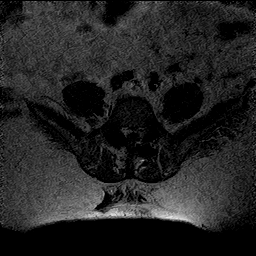
[im 10/31]
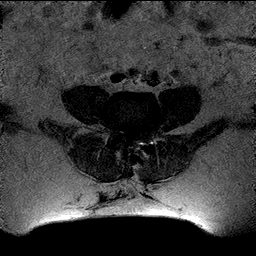
[im 12/31]
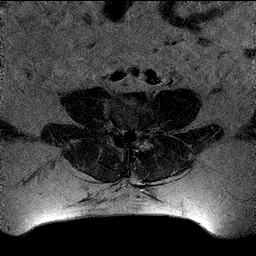
[im 14/31]
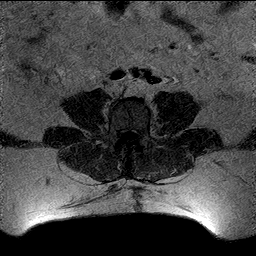
[im 17/31]
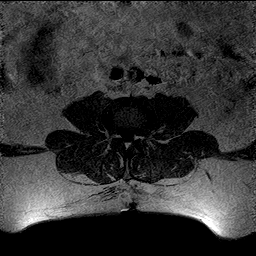
[im 19/31]
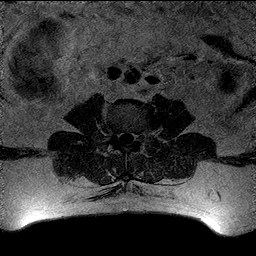
[im 21/31]
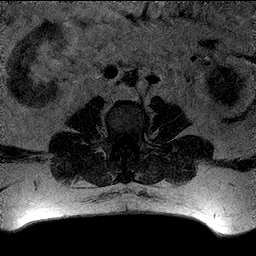
[im 24/31]
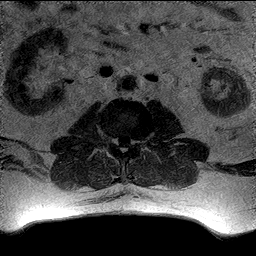
[im 26/31]
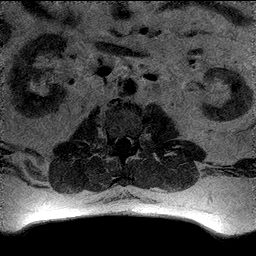
[im 28/31]
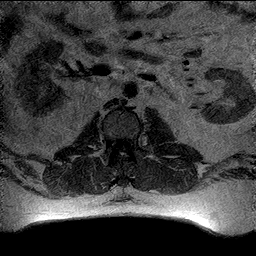
[im 31/31]
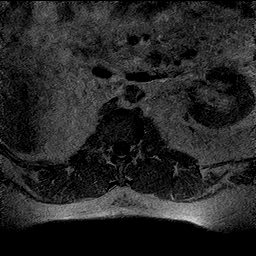

[48 of 48 positions shown; findings below may reference images not displayed]

FINDINGS: Comparison made to prior MRI lumbar spine evaluation from [facility] dated 09/30/2019. 

A mild lumbar levoscoliosis is again demonstrated.  Anterior to posterior alignment maintained.  The lumbar vertebral bodies themselves appear normal in height.  Stable small focus of sclerotic signal abnormality medial left iliac bone.  No focal lumbar vertebral body bone marrow signal abnormalities.  The conus appears normal.  Partial visualization of left renal superior pole cortical nodule, unchanged in comparison to prior study.  

Moderate lumbar degenerative disc disease is again apparent with early spondylotic change.  Congenital shortness of lumbar vertebral pedicles again observed.  Inferior epidural lipomatosis.  

Postsurgical changes are now apparent at the L4-L5 interspace with metallic artifact dorsally.  There appears to be resolution of acquired bilateral lateral recess stenoses and significant ventral disc bulging.  No significant accentuation of congenital spinal canal narrowing.  No evidence for frank foraminal compromise.  

Spondylosis again noted superior lumbar levels, with eccentric extradural spurring and disc bulging laterally to the left at L2-L3 and L3-L4, near apex of scoliotic curve.  No evidence for related foraminal compromise, spinal canal stenosis, or new focal nuclear herniation. 

Early spondylosis with minimal foraminal narrowing L5-S1, unchanged.
IMPRESSION: 1. Interval surgery at L4-L5 with interval resolution of acquired component of spinal canal stenosis.

2. Otherwise, stable lumbar spondylosis and degenerative disc disease with no evidence for a focal nuclear herniation or spinal canal/neural foraminal compromise.

3. Stable small focus of sclerosis medial left iliac bone, and left renal superior pole nodule, where imaged.  

4. Please see in-depth discussion in body of report.

## 2021-10-29 IMAGING — MR MRI HAND LT WO CONTRAST
8 series · 40 of 40 positions shown · non-contrast
Comparison: none

﻿

MAGNETIC RESONANCE IMAGING OF THE LEFT HAND WITHOUT INTRAVENOUS CONTRAST ADMINISTRATION
HISTORY: Mid hand pain.  History of prior surgery.  Claustrophobia.
TECHNIQUE: Multiplanar magnetic resonance imaging of the left hand was accomplished employing a surface coil and an open 0.3[employer] scanner without intravenous contrast administration.  T1 and T2-weighted thin slice sections were obtained.

[Series 1: z t/s/c scano · axial · left · 10.0mm · 1.33mm/px · z∈[-10,+170]mm · 2 of 9 slices shown (1 of 2)]
[im 1/9]
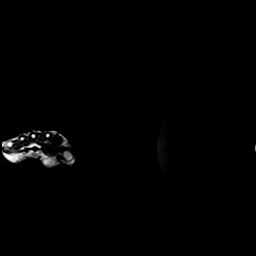
[im 9/9]
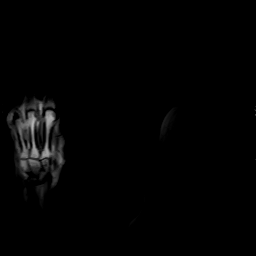

[Series 2: z t/s/c scano · axial · left · 10.0mm · 1.33mm/px · z∈[-23,+157]mm · 2 of 9 slices shown (2 of 2)]
[im 1/9]
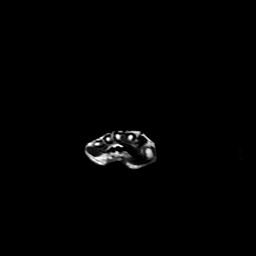
[im 9/9]
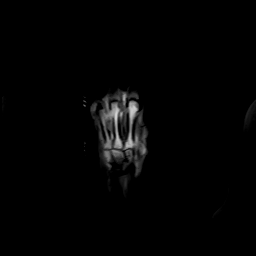

[Series 3: T1 · coronal · left · 3.0mm · 0.78mm/px · 4 of 15 slices shown (1 of 3)]
[im 1/15]
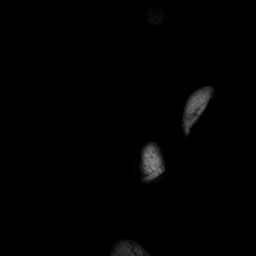
[im 5/15]
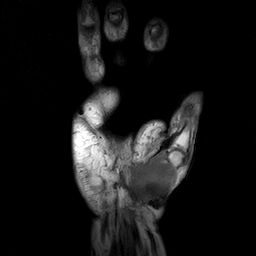
[im 10/15]
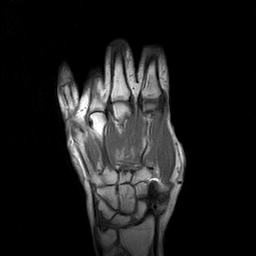
[im 15/15]
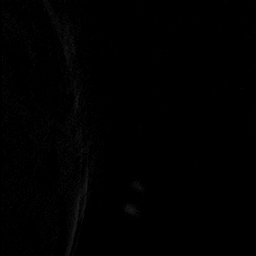

[Series 4: z ir cor · coronal · left · 3.0mm · 0.78mm/px · 4 of 14 slices shown]
[im 1/14]
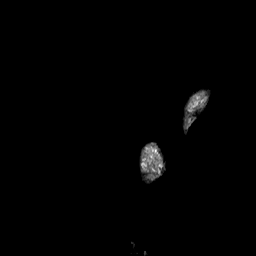
[im 5/14]
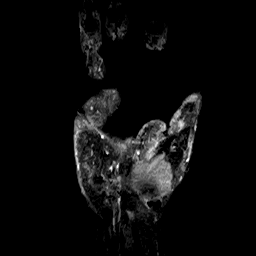
[im 9/14]
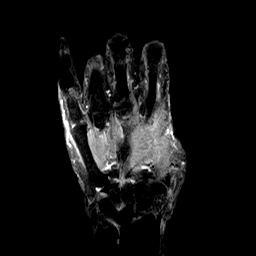
[im 14/14]
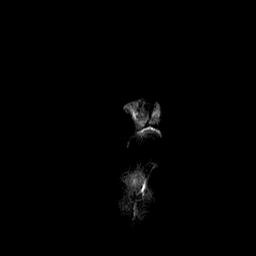

[Series 5: T2 · coronal · left · 3.0mm · 0.78mm/px · 4 of 15 slices shown]
[im 1/15]
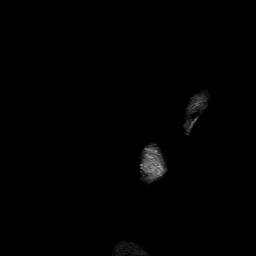
[im 5/15]
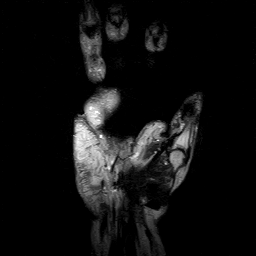
[im 10/15]
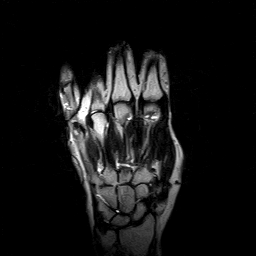
[im 15/15]
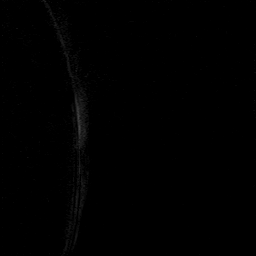

[Series 6: T1 · sagittal · left · 3.0mm · 0.78mm/px · 8 of 30 slices shown (2 of 3)]
[im 1/30]
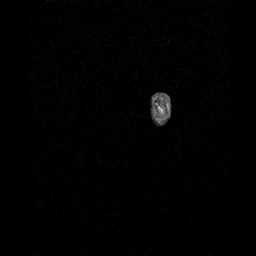
[im 5/30]
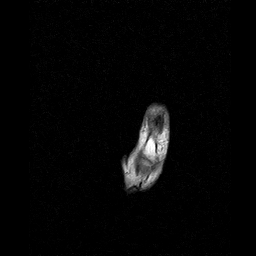
[im 9/30]
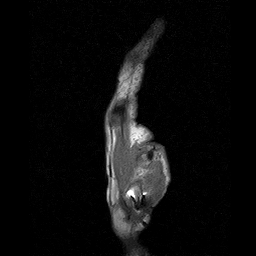
[im 13/30]
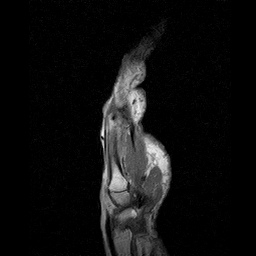
[im 17/30]
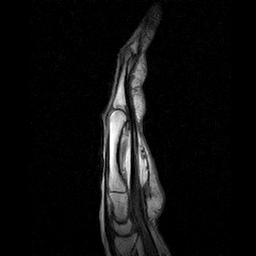
[im 21/30]
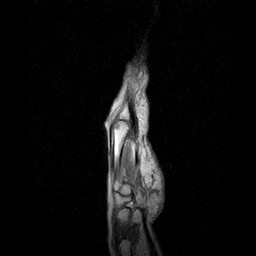
[im 25/30]
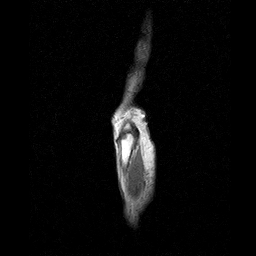
[im 30/30]
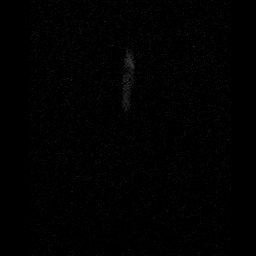

[Series 7: STIR · axial · left · 3.0mm · 0.59mm/px · z∈[-60,+56]mm · 8 of 30 slices shown]
[im 1/30]
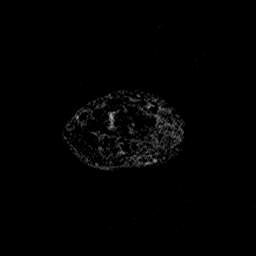
[im 5/30]
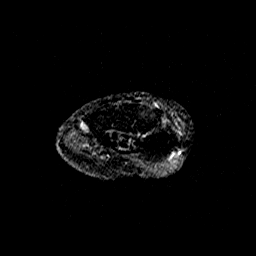
[im 9/30]
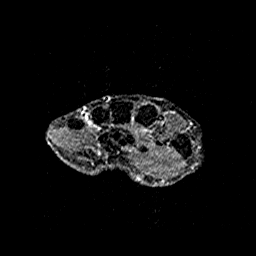
[im 13/30]
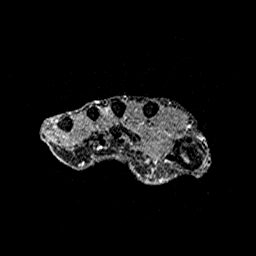
[im 17/30]
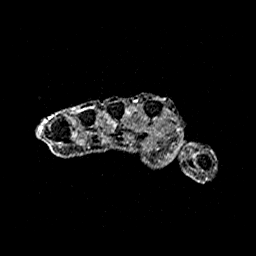
[im 21/30]
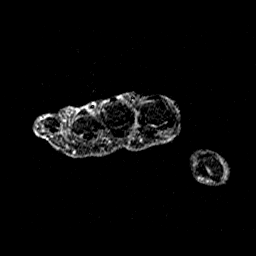
[im 25/30]
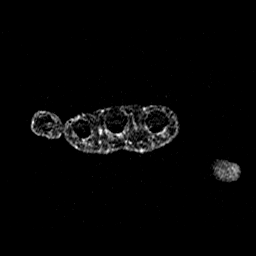
[im 30/30]
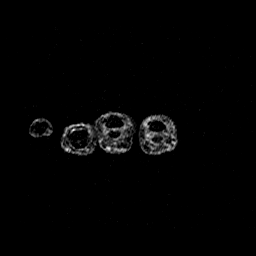

[Series 8: T1 · axial · left · 3.0mm · 0.59mm/px · z∈[-60,+56]mm · 8 of 30 slices shown (3 of 3)]
[im 1/30]
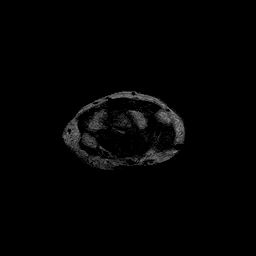
[im 5/30]
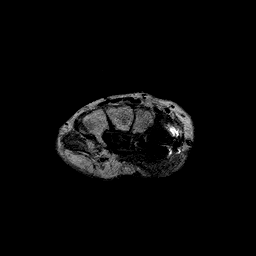
[im 9/30]
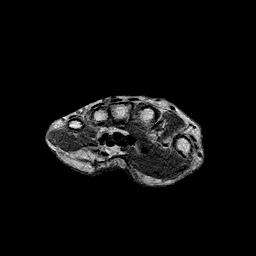
[im 13/30]
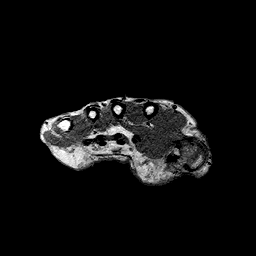
[im 17/30]
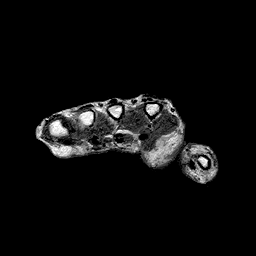
[im 21/30]
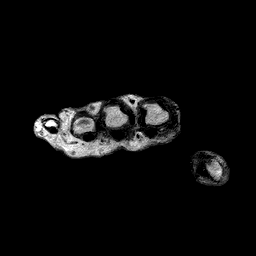
[im 25/30]
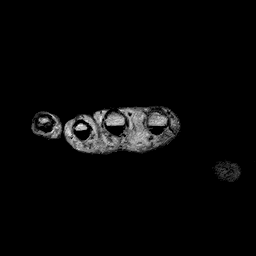
[im 30/30]
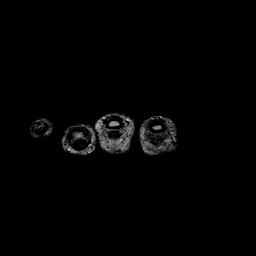

[40 of 40 positions shown; findings below may reference images not displayed]

FINDINGS: No prior studies are available for comparison purposes.  

The distal-most aspect of the distal phalanx of the third digit is not imaged on current study, due to technical factors. 

There is extensive magnetic field disruption/metallic artifact involving the lateral wrist and hand, epicenter first carpometacarpal articulation, consistent with prior surgery and metallic hardware.  This region cannot be evaluated. No bone marrow signal abnormalities are identified remainder of hand.  No evidence for fracture or significant subluxation/dislocation.  Diffuse osteoarthritic changes present, most prominently involving the first metacarpophalangeal and first interphalangeal joint though intercarpal degenerative change present as well.  No evidence for significant localized joint effusion.  No clear evidence for focal tendon or ligament tear.  No visualized carpal avascular necrosis.
IMPRESSION: 1. Metallic artifact related to prior surgery as described. 

2. No evidence for a fracture nor strong evidence for acute abnormality.  Osteoarthritic changes present. 

3. Please see in-depth discussion in body of report.

## 2023-03-23 IMAGING — MR MRI CERVICAL SPINE WITHOUT CONTRAST
7 of 8 series · 36 of 48 positions shown · non-contrast
Comparison: none

﻿

MAGNETIC RESONANCE IMAGING CERVICAL SPINE WITHOUT INTRAVENOUS CONTRAST ADMINISTRATION
HISTORY: Cervical spine pain.  Radiculopathy.  History of prior surgery.  Claustrophobia.
TECHNIQUE: Multiplanar magnetic resonance imaging of the cervical spine was accomplished utilizing a surface coil and an open 0.3[employer] Tae scanner without intravenous contrast administration.  T1 and T2-weighted thin slice sections were obtained.

[Series 1: scano s/c · axial · 5.0mm · 1.02mm/px · z∈[-8,+130]mm · 4 of 14 slices shown]
[im 1/14]
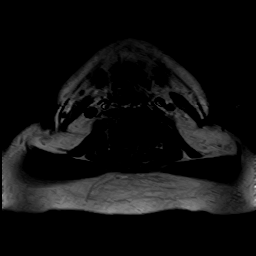
[im 5/14]
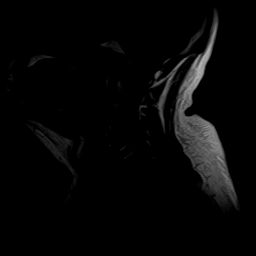
[im 9/14]
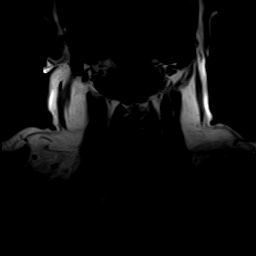
[im 14/14]
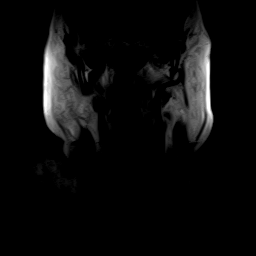

[Series 2: sag fir · sagittal · 3.0mm · 0.94mm/px · 5 of 15 slices shown]
[im 1/15]
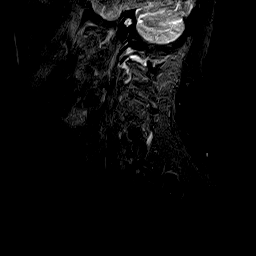
[im 4/15]
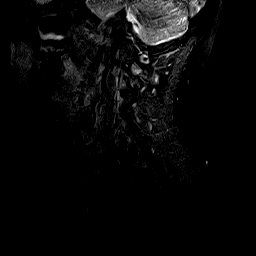
[im 8/15]
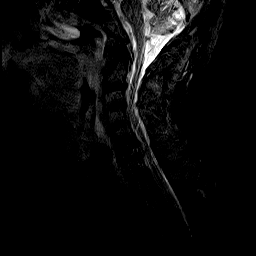
[im 11/15]
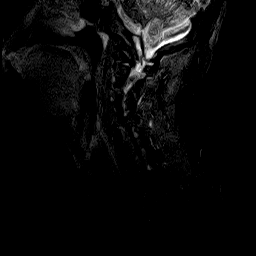
[im 15/15]
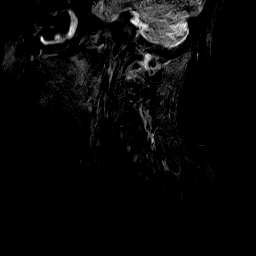

[Series 3: T1 · sagittal · 3.0mm · 0.94mm/px · 5 of 15 slices shown]
[im 1/15]
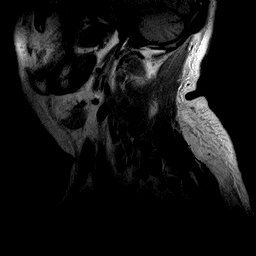
[im 4/15]
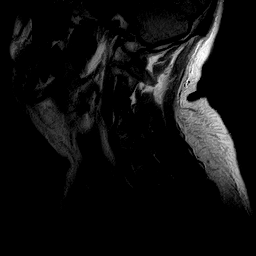
[im 8/15]
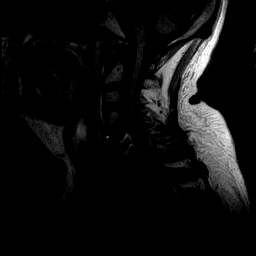
[im 11/15]
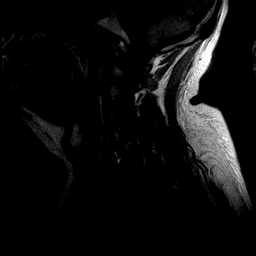
[im 15/15]
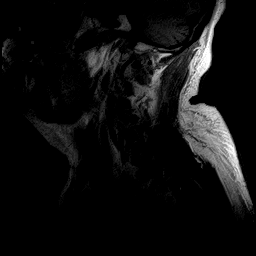

[Series 5: trs ge w/mtc · axial · 3.5mm · 1.02mm/px · z∈[-96,+14]mm · 8 of 26 slices shown]
[im 1/26]
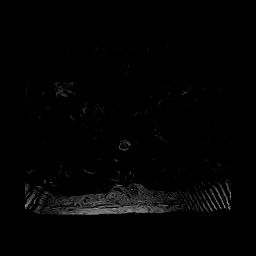
[im 4/26]
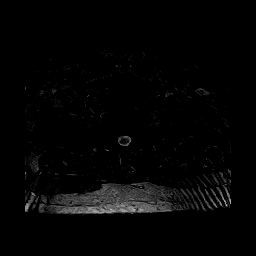
[im 8/26]
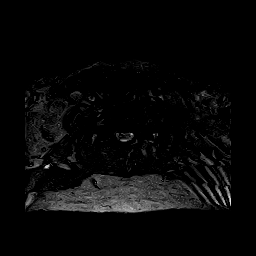
[im 11/26]
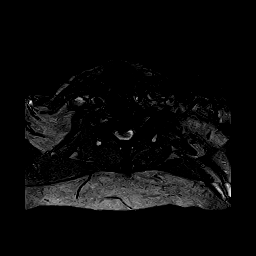
[im 15/26]
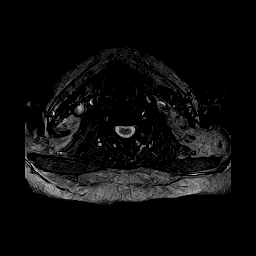
[im 18/26]
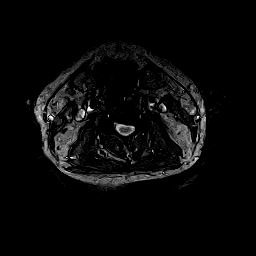
[im 22/26]
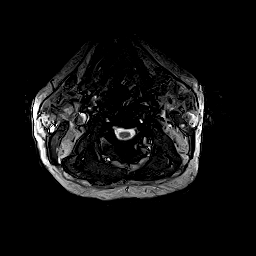
[im 26/26]
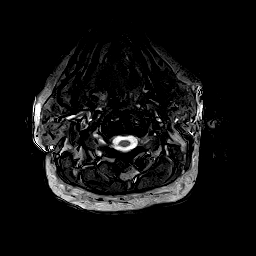

[Series 6: (id) · axial · 3.5mm · 1.02mm/px · z∈[-96,+14]mm · 8 of 26 slices shown]
[im 1/26]
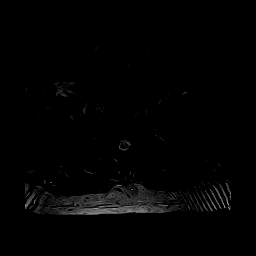
[im 4/26]
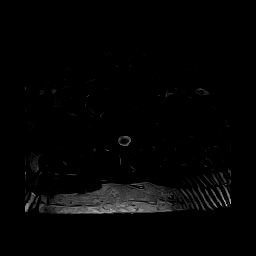
[im 8/26]
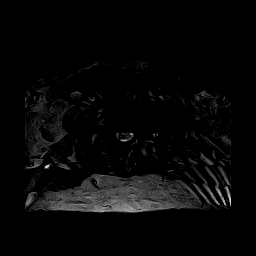
[im 11/26]
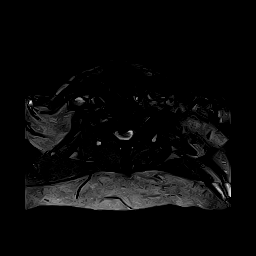
[im 15/26]
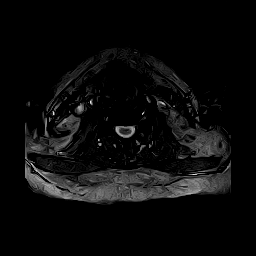
[im 18/26]
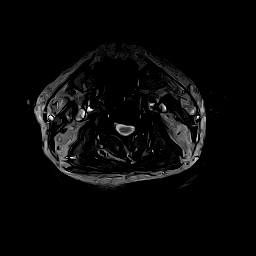
[im 22/26]
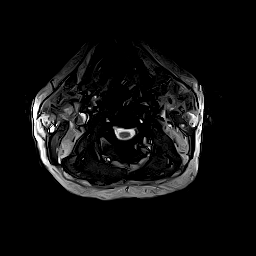
[im 26/26]
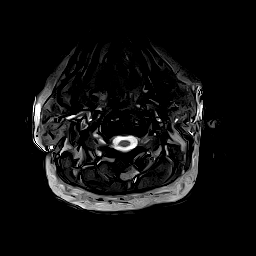

[Series 7: cor shim · coronal · 10.0mm · 4.69mm/px · 1 of 3 slices shown]
[im 1/3]
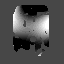

[Series 9: T2 · sagittal · 3.0mm · 0.94mm/px · 5 of 15 slices shown]
[im 1/15]
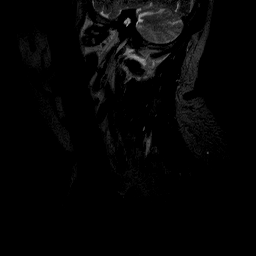
[im 4/15]
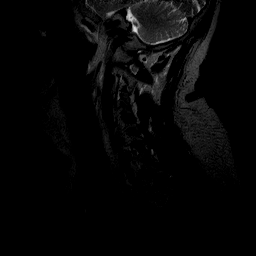
[im 8/15]
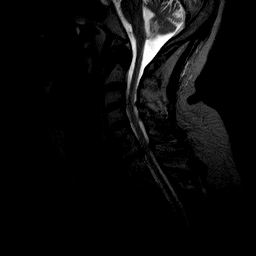
[im 11/15]
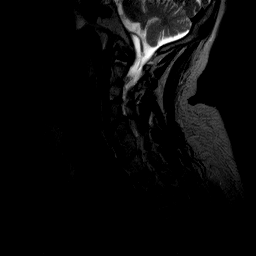
[im 15/15]
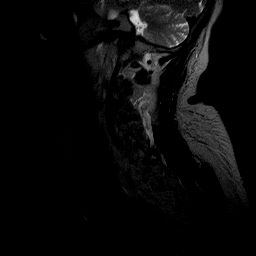

[36 of 48 positions shown; findings below may reference images not displayed]

FINDINGS: Comparison made to prior MRI cervical spine evaluation from [facility] dated 05/28/2020.  

Cervical kyphosis and mild levoscoliosis demonstrated.  This likely represents chronic malalignment though muscle spasm is not excluded.  Additionally, there is new minor retrolisthesis of the C6 in relation to C7 vertebra at 2-3 mm.  Interval surgery with metallic anterior plate and screw and interbody fusion C5 to C6.  Localized magnetic field disruption/artifact.  The cervical vertebrae otherwise appear normal in height.  There are no significant abnormalities of regional bone marrow signal.  

Cerebellar tonsils normally positioned.  Partial visualization of mild to moderate chronic-appearing bilateral maxillary paranasal sinusitis, slightly progressive in comparison to prior study.  No significant focal abnormalities of included paraspinal soft tissues.

Moderately severe cervical spondylosis and degenerative disc disease is again demonstrated with disc desiccation and disc space narrowing, extradural spurring, and facet joint sclerosis and mild overgrowth. 

Examination of the postoperative C5-C6 interspace demonstrates no evidence for residual spinal canal stenosis or a recurrent nuclear herniation.  Spondylolysis laterally with spurring and moderate left greater than right bilateral C6 neural foraminal stenoses. 

Examination of the C6-C7 interspace, site of minor C6 retrolisthesis, demonstrates slight progression of spondylosis.  Broad-based ventral disc and spur complex with small central disc protrusion.  Facet joint overgrowth dorsally.  Slight progression of central and lateral cervical spinal canal stenosis with cord flattening.  Moderate to severe left greater than right bilateral C7 neural foraminal stenoses persist.

Spondylosis eccentric left C3-C4 interspace with ventral disc and spur complex.  No significant spinal canal stenosis.  Moderate left and mild to moderate right bilateral C4 neural foraminal stenoses.  

Early degenerative changes/spondylosis at the remaining cervical interspaces without spinal canal or neural foraminal compromise. 

When allowance is made for patient motion, there is no evidence for cord enlargement or localized myelomalacia.
IMPRESSION: 1. Moderately severe generalized cervical spondylosis and degenerative disc disease as described. 

2. Interval surgery/fusion at C5-C6 with resolution of spinal canal stenosis.  Persistent bilateral C6 neural foraminal stenoses. 

3. Slight progression of spondylosis at C6-C7 interspace where new minor C6 retrolisthesis present.  Slight progression of central and lateral cervical spinal canal stenosis.  Persistent bilateral C7 neural foraminal stenoses.  

4. Multiple additional more mild extradural abnormalities as described. 

5. Bilateral maxillary paranasal sinusitis, slightly progressive in comparison to prior study. 

6. Please see in-depth discussion in body of report.
